# Patient Record
Sex: Female | Born: 1980 | Race: White | Hispanic: No | Marital: Married | Smoking: Never smoker
Health system: Southern US, Community
[De-identification: ages and names within clinical notes are randomized; demographics above are authoritative.]

## PROBLEM LIST (undated history)

## (undated) ENCOUNTER — Inpatient Hospital Stay (HOSPITAL_COMMUNITY): Payer: Self-pay

## (undated) DIAGNOSIS — IMO0002 Reserved for concepts with insufficient information to code with codable children: Secondary | ICD-10-CM

## (undated) DIAGNOSIS — R87619 Unspecified abnormal cytological findings in specimens from cervix uteri: Secondary | ICD-10-CM

## (undated) DIAGNOSIS — Z8619 Personal history of other infectious and parasitic diseases: Secondary | ICD-10-CM

## (undated) HISTORY — DX: Personal history of other infectious and parasitic diseases: Z86.19

## (undated) HISTORY — PX: BREAST SURGERY: SHX581

## (undated) HISTORY — DX: Unspecified abnormal cytological findings in specimens from cervix uteri: R87.619

## (undated) HISTORY — DX: Reserved for concepts with insufficient information to code with codable children: IMO0002

---
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
## 2012-07-29 ENCOUNTER — Other Ambulatory Visit (HOSPITAL_COMMUNITY): Payer: Self-pay | Admitting: Gynecology

## 2012-07-29 DIAGNOSIS — Z3141 Encounter for fertility testing: Secondary | ICD-10-CM

## 2012-08-05 ENCOUNTER — Ambulatory Visit (HOSPITAL_COMMUNITY): Payer: 59

## 2012-08-22 ENCOUNTER — Other Ambulatory Visit (HOSPITAL_COMMUNITY): Payer: Self-pay | Admitting: Gynecology

## 2012-08-22 DIAGNOSIS — Z3141 Encounter for fertility testing: Secondary | ICD-10-CM

## 2012-08-27 ENCOUNTER — Ambulatory Visit (HOSPITAL_COMMUNITY): Admission: RE | Admit: 2012-08-27 | Payer: 59 | Source: Ambulatory Visit

## 2012-08-28 ENCOUNTER — Ambulatory Visit (HOSPITAL_COMMUNITY): Payer: 59

## 2012-08-29 ENCOUNTER — Ambulatory Visit (HOSPITAL_COMMUNITY)
Admission: RE | Admit: 2012-08-29 | Discharge: 2012-08-29 | Disposition: A | Discharge status: Home / Self Care | Payer: 59 | Source: Ambulatory Visit | Attending: Gynecology | Admitting: Gynecology

## 2012-08-29 DIAGNOSIS — Z3141 Encounter for fertility testing: Secondary | ICD-10-CM

## 2012-08-29 DIAGNOSIS — N979 Female infertility, unspecified: Secondary | ICD-10-CM | POA: Insufficient documentation

## 2012-08-29 IMAGING — RF DG HYSTEROGRAM
6 series · 12 of 12 positions shown · non-contrast
Comparison: none

***ADDENDUM*** CREATED: [DATE] [DATE]

Addendum by Dr. SEO on [DATE].  I was asked by the
referring physician's office to review this examination and comment
on the transverse processes.  Please note this examination is not a
diagnostic examination for evaluation of bony anatomy, although
allowing for technique, there is no visualized abnormality in the
visualized aspects of the bony pelvis, including the transverse
processes at the expected L5 level.  There can be normal anatomic
variation and asymmetry, which may account for the apparent smaller
size of the left transverse process as compared to the right.  If
there is a clinical question referring to the lumbar spine,
diagnostic lumbar spine five view series would be recommended for
further evaluation.
CLINICAL DATA: Infertility
HYSTEROSALPINGOGRAM
TECHNIQUE: Hysterosalpingogram was performed by the ordering
physician under fluoroscopy.  Fluoroscopic images are submitted for
interpretation following the procedure.
Fluoroscopy Time:  1.0 minutes.

[Series 1: run · 2 of 2 slices shown (1 of 6)]
[im 1/2]
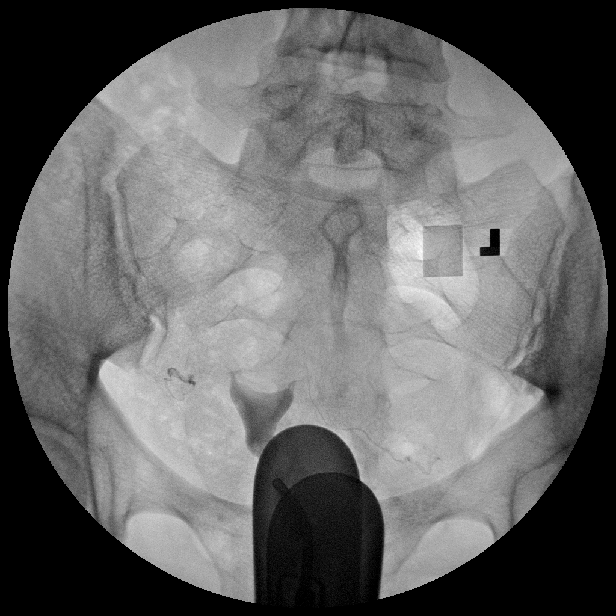
[im 2/2]
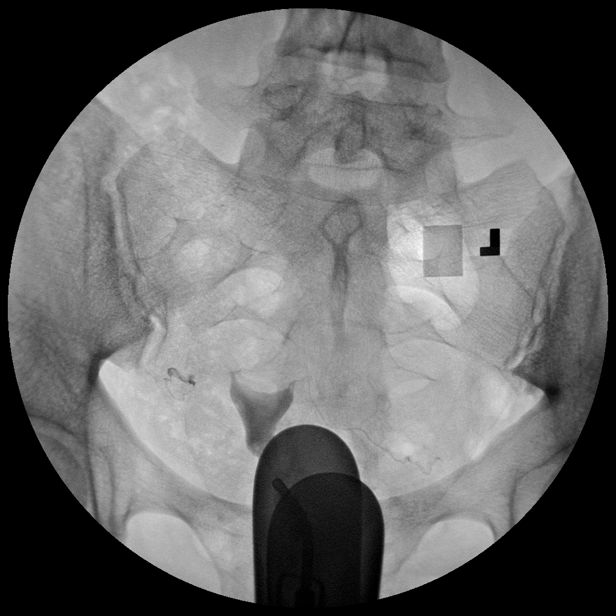

[Series 2: run · 2 of 2 slices shown (2 of 6)]
[im 1/2]
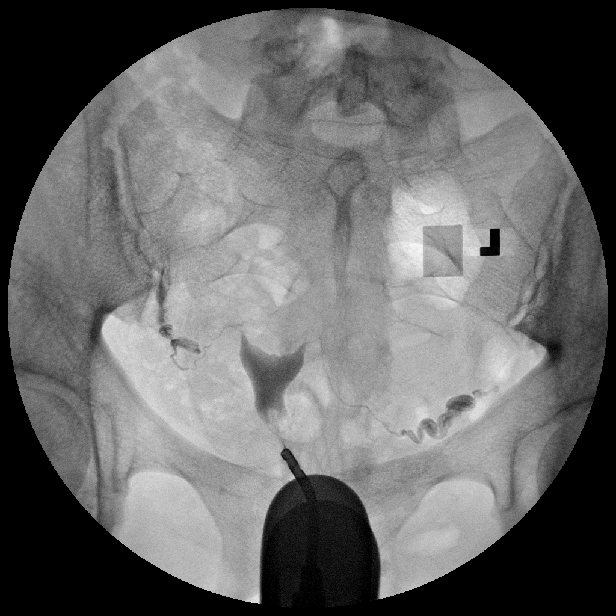
[im 2/2]
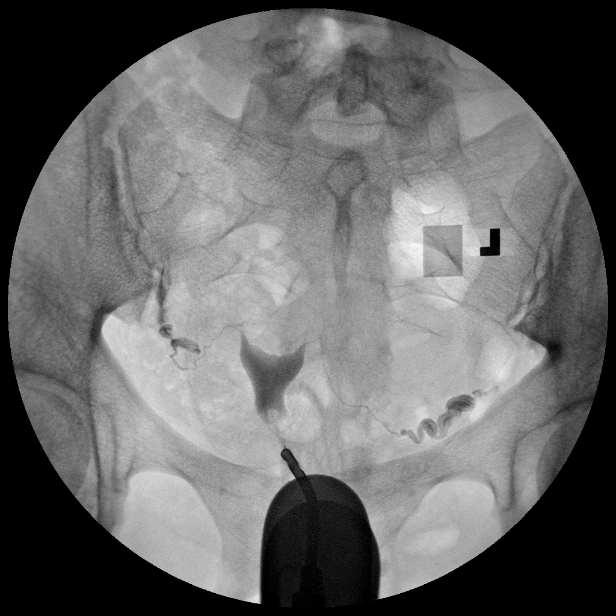

[Series 3: run · 2 of 2 slices shown (3 of 6)]
[im 1/2]
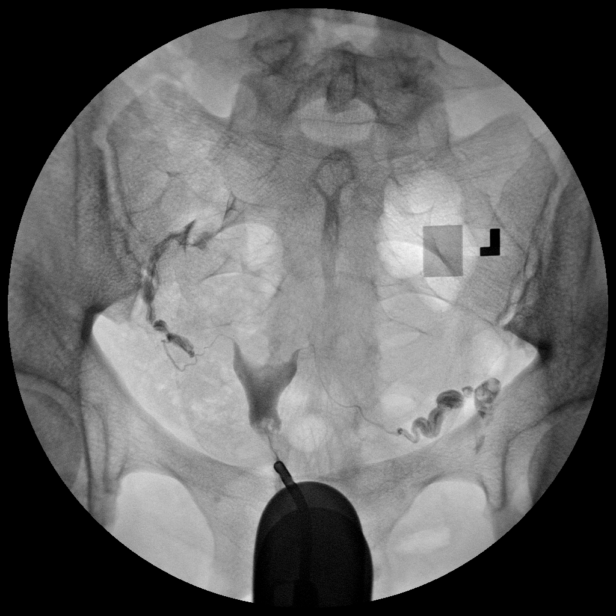
[im 2/2]
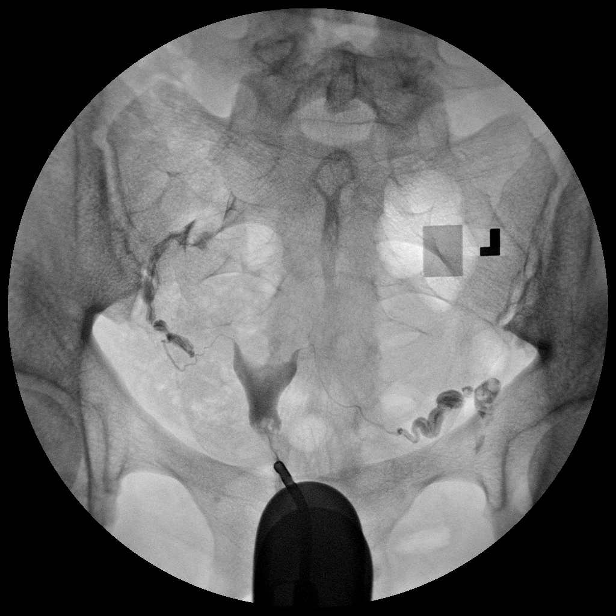

[Series 4: run · 2 of 2 slices shown (4 of 6)]
[im 1/2]
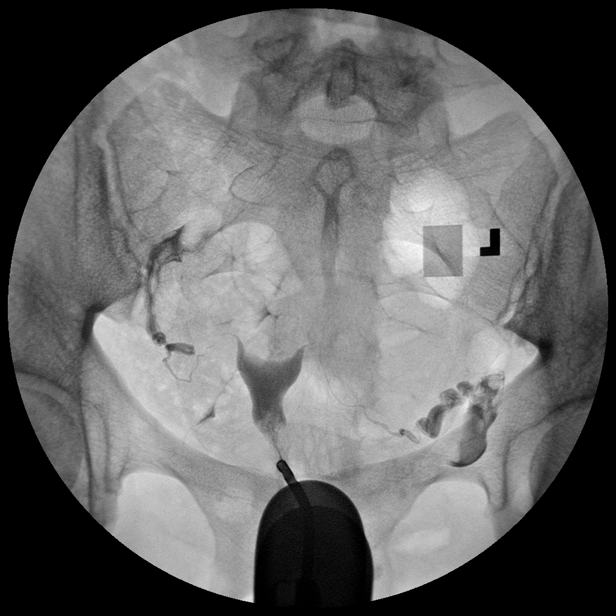
[im 2/2]
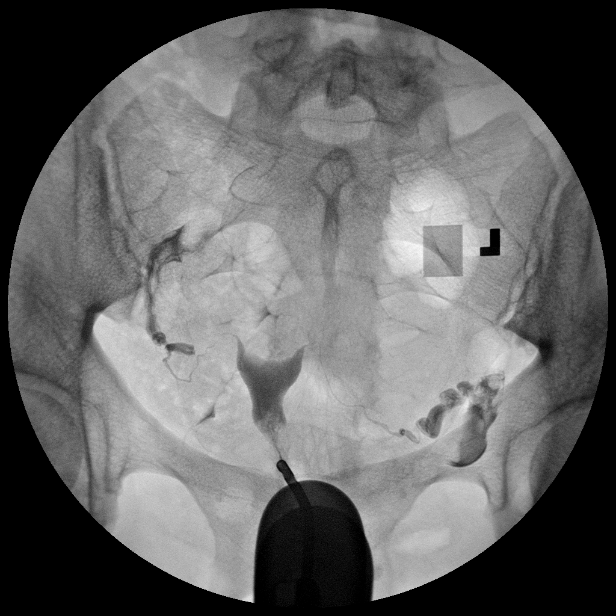

[Series 5: run · 2 of 2 slices shown (5 of 6)]
[im 1/2]
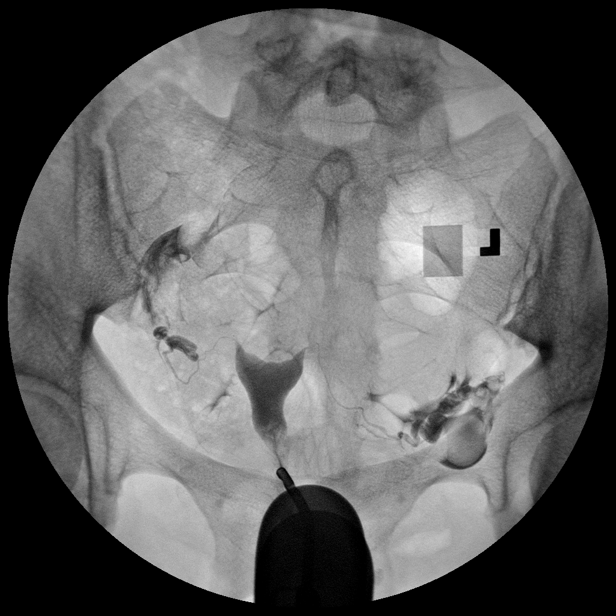
[im 2/2]
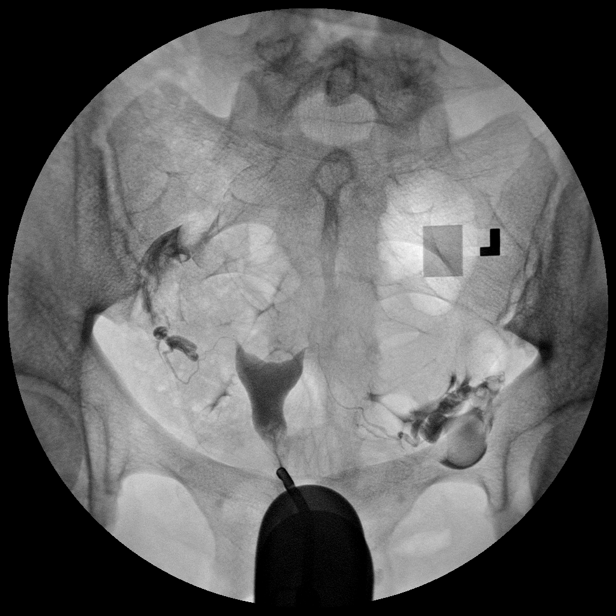

[Series 6: run · 2 of 2 slices shown (6 of 6)]
[im 1/2]
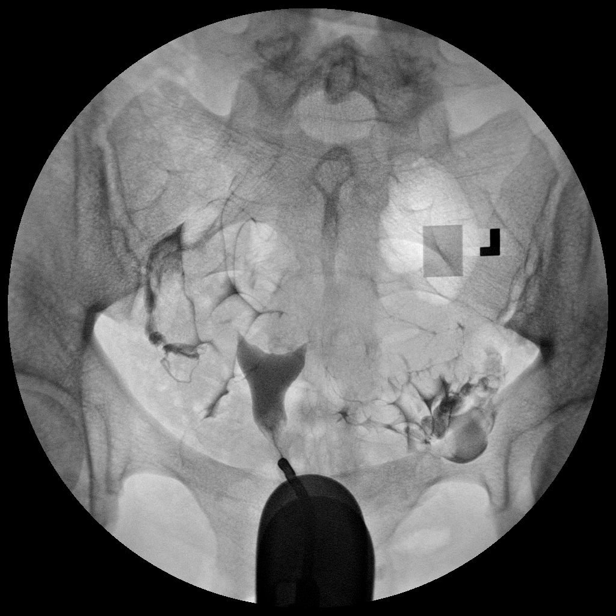
[im 2/2]
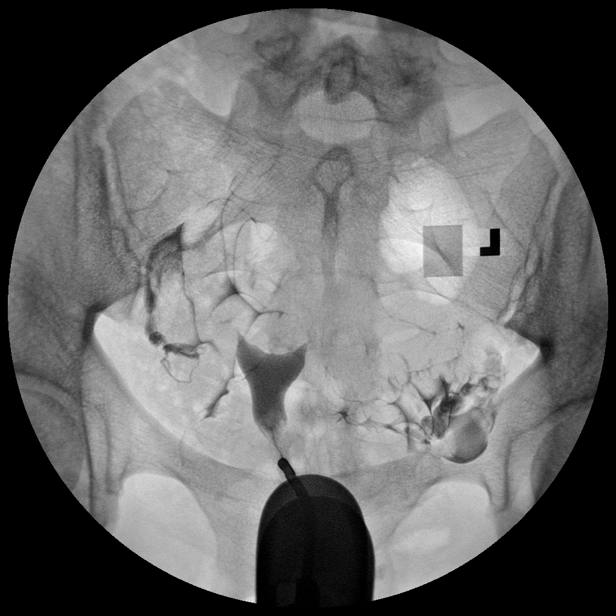

[12 of 12 positions shown; findings below may reference images not displayed]

FINDINGS: The endometrial cavity of the uterus is normal in contour
and appearance.

Contrast filling of both fallopian tubes is seen, and both tubes
are normal in appearance.  Intraperitoneal spill of the contrast
from both fallopian tubes is demonstrated.
IMPRESSION: Normal study.  Fallopian tubes are patent bilaterally.

## 2012-08-29 MED ORDER — IOHEXOL 300 MG/ML  SOLN
20.0000 mL | Freq: Once | INTRAMUSCULAR | Status: AC | PRN
  Administered 2012-08-29: 6 mL

## 2012-12-05 LAB — OB RESULTS CONSOLE ANTIBODY SCREEN: Antibody Screen: NEGATIVE

## 2012-12-05 LAB — OB RESULTS CONSOLE RUBELLA ANTIBODY, IGM: Rubella: IMMUNE

## 2012-12-05 LAB — OB RESULTS CONSOLE RPR: RPR: NONREACTIVE

## 2012-12-05 LAB — OB RESULTS CONSOLE ABO/RH: RH Type: POSITIVE

## 2012-12-05 LAB — OB RESULTS CONSOLE HEPATITIS B SURFACE ANTIGEN: Hepatitis B Surface Ag: NEGATIVE

## 2013-05-10 ENCOUNTER — Inpatient Hospital Stay (HOSPITAL_COMMUNITY): Payer: Managed Care, Other (non HMO)

## 2013-05-10 ENCOUNTER — Encounter (HOSPITAL_COMMUNITY): Payer: Self-pay | Admitting: Family

## 2013-05-10 ENCOUNTER — Inpatient Hospital Stay (HOSPITAL_COMMUNITY)
Admission: AD | Admit: 2013-05-10 | Discharge: 2013-05-10 | Disposition: A | Discharge status: Home / Self Care | Payer: Managed Care, Other (non HMO) | Source: Ambulatory Visit | Attending: Obstetrics and Gynecology | Admitting: Obstetrics and Gynecology

## 2013-05-10 DIAGNOSIS — O4690 Antepartum hemorrhage, unspecified, unspecified trimester: Secondary | ICD-10-CM | POA: Insufficient documentation

## 2013-05-10 DIAGNOSIS — O4693 Antepartum hemorrhage, unspecified, third trimester: Secondary | ICD-10-CM

## 2013-05-10 LAB — CBC
HCT: 37.7 % (ref 36.0–46.0)
MCH: 29.5 pg (ref 26.0–34.0)
MCHC: 34.2 g/dL (ref 30.0–36.0)
MCV: 86.3 fL (ref 78.0–100.0)
Platelets: 161 10*3/uL (ref 150–400)
RBC: 4.37 MIL/uL (ref 3.87–5.11)
RDW: 14.4 % (ref 11.5–15.5)

## 2013-05-10 LAB — URINALYSIS, ROUTINE W REFLEX MICROSCOPIC
Bilirubin Urine: NEGATIVE
Glucose, UA: NEGATIVE mg/dL
Ketones, ur: NEGATIVE mg/dL
Leukocytes, UA: NEGATIVE
Nitrite: NEGATIVE
Protein, ur: NEGATIVE mg/dL
Specific Gravity, Urine: <1.005 — ABNORMAL LOW (ref 1.005–1.030)
Urobilinogen, UA: 0.2 mg/dL (ref 0.0–1.0)
pH: 6.5 (ref 5.0–8.0)

## 2013-05-10 LAB — URINE MICROSCOPIC-ADD ON

## 2013-05-10 NOTE — MAU Note (Addendum)
Pt states had sex last pm. Had to wear panty liner r/t spotting that began this am around 1000. Told to come in per MD for eval.

## 2013-05-10 NOTE — MAU Note (Signed)
32 yo, G1P0 at [redacted]w[redacted]d, presents to MAU with co VB after intercourse last night. Denies cramping; reports +FM.

## 2013-05-10 NOTE — MAU Provider Note (Signed)
History     CSN: [id number]  Arrival date and time: 05/10/13 1136   First Provider Initiated Contact with Patient 05/10/13 1211      Chief Complaint  Patient presents with  . Vaginal Bleeding   HPI Ms. Tina Lowery is a 32 y.o. G1P0 at [redacted]w[redacted]d who presents to MAU today with complaint of vaginal bleeding since 0945 today. The patient states that the bleeding has developed into that similar to a light period. She denies history of bleeding in this pregnancy or any complications during this pregnancy. She denies abdominal pain or contractions today. She reports good fetal movement. She endorses intercourse last night.   OB History   Grav Para Term Preterm Abortions TAB SAB Ect Mult Living   1               No past medical history on file.  No past surgical history on file.  No family history on file.  History  Substance Use Topics  . Smoking status: Not on file  . Smokeless tobacco: Not on file  . Alcohol Use: Not on file    Allergies:  Allergies  Allergen Reactions  . Penicillins Swelling    Prescriptions prior to admission  Medication Sig Dispense Refill  . diphenhydrAMINE (BENADRYL) 25 MG tablet Take 25 mg by mouth every 6 (six) hours as needed for itching or sleep.      . fexofenadine (ALLEGRA) 60 MG tablet Take 60 mg by mouth daily.      . Prenatal Vit-Fe Fumarate-FA (PRENATAL MULTIVITAMIN) TABS tablet Take 1 tablet by mouth daily at 12 noon.        Review of Systems  Constitutional: Negative for malaise/fatigue.  Gastrointestinal: Negative for abdominal pain.  Genitourinary: Negative for dysuria, urgency and frequency.       + vaginal bleeding Neg - vaginal discharge   Physical Exam   Blood pressure 134/97, pulse 107, temperature 98.4 F (36.9 C), temperature source Oral, resp. rate 16, last menstrual period 08/22/2012.  Physical Exam  Constitutional: She is oriented to person, place, and time. She appears well-developed and well-nourished. No  distress.  HENT:  Head: Normocephalic and atraumatic.  Cardiovascular: Normal rate, regular rhythm and normal heart sounds.   Respiratory: Effort normal and breath sounds normal. No respiratory distress.  GI: Soft. Bowel sounds are normal. She exhibits no distension and no mass. There is no tenderness. There is no rebound and no guarding.  Genitourinary: Uterus is enlarged (appropriate for GA). Uterus is not tender. Cervix exhibits no motion tenderness, no discharge and no friability. There is bleeding (moderate blood noted in the vagina with one small clot) around the vagina. No vaginal discharge found.  Neurological: She is alert and oriented to person, place, and time.  Skin: Skin is warm and dry. No erythema.  Psychiatric: She has a normal mood and affect.  Cervix: closed, long, thick and posterior   Results for orders placed during the hospital encounter of 05/10/13 (from the past 24 hour(s))  URINALYSIS, ROUTINE W REFLEX MICROSCOPIC     Status: Abnormal   Collection Time    05/10/13 11:45 AM      Result Value Range   Color, Urine RED (*) YELLOW   APPearance HAZY (*) CLEAR   Specific Gravity, Urine <1.005 (*) 1.005 - 1.030   pH 6.5  5.0 - 8.0   Glucose, UA NEGATIVE  NEGATIVE mg/dL   Hgb urine dipstick LARGE (*) NEGATIVE   Bilirubin Urine NEGATIVE  NEGATIVE  Ketones, ur NEGATIVE  NEGATIVE mg/dL   Protein, ur NEGATIVE  NEGATIVE mg/dL   Urobilinogen, UA 0.2  0.0 - 1.0 mg/dL   Nitrite NEGATIVE  NEGATIVE   Leukocytes, UA NEGATIVE  NEGATIVE  URINE MICROSCOPIC-ADD ON     Status: Abnormal   Collection Time    05/10/13 11:45 AM      Result Value Range   Squamous Epithelial / LPF FEW (*) RARE   WBC, UA 0-2  <3 WBC/hpf   RBC / HPF 21-50  <3 RBC/hpf   Bacteria, UA FEW (*) RARE  CBC     Status: None   Collection Time    05/10/13 12:51 PM      Result Value Range   WBC 9.9  4.0 - 10.5 K/uL   RBC 4.37  3.87 - 5.11 MIL/uL   Hemoglobin 12.9  12.0 - 15.0 g/dL   HCT 16.1  09.6 - 04.5 %    MCV 86.3  78.0 - 100.0 fL   MCH 29.5  26.0 - 34.0 pg   MCHC 34.2  30.0 - 36.0 g/dL   RDW 40.9  81.1 - 91.4 %   Platelets 161  150 - 400 K/uL   US OB Limited  Shows no evidence of previa or abruption  MAU Course  Procedures  MDM Discussed with Dr. Marcelle Overlie. Get a CBC and Limited US today Discussed lab and [place] results with Dr. Marcelle Overlie. Ok for discharge with bleeding precautions. Follow-up as scheduled this week Assessment and Plan  A: Vaginal bleeding in pregnancy, third trimester  P: Discharge home Bleeding precautions discussed Patient advised to keep scheduled follow-up this week in the office Patient may return to MAU as needed or if her condition were to change or worsen   Freddi Starr, PA-C  05/10/2013, 3:02 PM

## 2013-06-15 LAB — OB RESULTS CONSOLE GBS: GBS: POSITIVE

## 2013-07-03 ENCOUNTER — Telehealth (HOSPITAL_COMMUNITY): Payer: Self-pay | Admitting: *Deleted

## 2013-07-03 ENCOUNTER — Encounter (HOSPITAL_COMMUNITY): Payer: Self-pay | Admitting: *Deleted

## 2013-07-03 NOTE — Telephone Encounter (Signed)
Preadmission screen  

## 2013-07-10 ENCOUNTER — Encounter (HOSPITAL_COMMUNITY): Payer: Self-pay | Admitting: *Deleted

## 2013-07-10 ENCOUNTER — Inpatient Hospital Stay (HOSPITAL_COMMUNITY)
Admission: AD | Admit: 2013-07-10 | Discharge: 2013-07-12 | DRG: 775 | Disposition: A | Discharge status: Home / Self Care | Payer: Managed Care, Other (non HMO) | Source: Ambulatory Visit | Attending: Obstetrics and Gynecology | Admitting: Obstetrics and Gynecology

## 2013-07-10 ENCOUNTER — Encounter (HOSPITAL_COMMUNITY): Payer: Managed Care, Other (non HMO) | Admitting: Anesthesiology

## 2013-07-10 ENCOUNTER — Inpatient Hospital Stay (HOSPITAL_COMMUNITY): Payer: Managed Care, Other (non HMO) | Admitting: Anesthesiology

## 2013-07-10 DIAGNOSIS — O4290 Premature rupture of membranes, unspecified as to length of time between rupture and onset of labor, unspecified weeks of gestation: Secondary | ICD-10-CM

## 2013-07-10 DIAGNOSIS — O99892 Other specified diseases and conditions complicating childbirth: Secondary | ICD-10-CM | POA: Diagnosis present

## 2013-07-10 DIAGNOSIS — Z22330 Carrier of Group B streptococcus: Secondary | ICD-10-CM

## 2013-07-10 DIAGNOSIS — O139 Gestational [pregnancy-induced] hypertension without significant proteinuria, unspecified trimester: Secondary | ICD-10-CM | POA: Diagnosis present

## 2013-07-10 LAB — CBC
MCH: 28.8 pg (ref 26.0–34.0)
MCV: 83.2 fL (ref 78.0–100.0)
Platelets: 155 10*3/uL (ref 150–400)
RBC: 4.52 MIL/uL (ref 3.87–5.11)
RDW: 13.7 % (ref 11.5–15.5)
WBC: 9.7 10*3/uL (ref 4.0–10.5)

## 2013-07-10 LAB — POCT FERN TEST: POCT Fern Test: POSITIVE

## 2013-07-10 LAB — URINALYSIS, ROUTINE W REFLEX MICROSCOPIC
Bilirubin Urine: NEGATIVE
Ketones, ur: 15 mg/dL — AB
Nitrite: NEGATIVE
Urobilinogen, UA: 0.2 mg/dL (ref 0.0–1.0)
pH: 6 (ref 5.0–8.0)

## 2013-07-10 LAB — COMPREHENSIVE METABOLIC PANEL
ALT: 16 U/L (ref 0–35)
Albumin: 2.8 g/dL — ABNORMAL LOW (ref 3.5–5.2)
Alkaline Phosphatase: 151 U/L — ABNORMAL HIGH (ref 39–117)
BUN: 8 mg/dL (ref 6–23)
Chloride: 100 mEq/L (ref 96–112)
Creatinine, Ser: 0.54 mg/dL (ref 0.50–1.10)
GFR calc Af Amer: >90 mL/min (ref >90)
GFR calc non Af Amer: >90 mL/min (ref >90)
Glucose, Bld: 99 mg/dL (ref 70–99)
Potassium: 3.8 mEq/L (ref 3.5–5.1)
Total Bilirubin: 0.1 mg/dL — ABNORMAL LOW (ref 0.3–1.2)
Total Protein: 6.1 g/dL (ref 6.0–8.3)

## 2013-07-10 LAB — PROTEIN / CREATININE RATIO, URINE
Creatinine, Urine: 72.67 mg/dL
Protein Creatinine Ratio: 0.15 (ref 0.00–0.15)
Total Protein, Urine: 10.6 mg/dL

## 2013-07-10 LAB — URINE MICROSCOPIC-ADD ON

## 2013-07-10 MED ORDER — FENTANYL 2.5 MCG/ML BUPIVACAINE 1/10 % EPIDURAL INFUSION (WH - ANES)
14.0000 mL/h | INTRAMUSCULAR | Status: DC | PRN
  Administered 2013-07-10: 14 mL/h via EPIDURAL
  Filled 2013-07-10: qty 125

## 2013-07-10 MED ORDER — CITRIC ACID-SODIUM CITRATE 334-500 MG/5ML PO SOLN: 30.0000 mL | ORAL | Status: DC | PRN

## 2013-07-10 MED ORDER — OXYTOCIN 40 UNITS IN LACTATED RINGERS INFUSION - SIMPLE MED
1.0000 m[IU]/min | INTRAVENOUS | Status: DC
  Administered 2013-07-10: 2 m[IU]/min via INTRAVENOUS
  Filled 2013-07-10: qty 1000

## 2013-07-10 MED ORDER — WITCH HAZEL-GLYCERIN EX PADS
1.0000 "application " | MEDICATED_PAD | CUTANEOUS | Status: DC | PRN
  Administered 2013-07-12: 1 via TOPICAL

## 2013-07-10 MED ORDER — LACTATED RINGERS IV SOLN
INTRAVENOUS | Status: DC
  Administered 2013-07-10 (×3): via INTRAVENOUS

## 2013-07-10 MED ORDER — SENNOSIDES-DOCUSATE SODIUM 8.6-50 MG PO TABS
2.0000 | ORAL_TABLET | ORAL | Status: DC
  Administered 2013-07-11 – 2013-07-12 (×2): 2 via ORAL
  Filled 2013-07-10 (×2): qty 2

## 2013-07-10 MED ORDER — PHENYLEPHRINE 40 MCG/ML (10ML) SYRINGE FOR IV PUSH (FOR BLOOD PRESSURE SUPPORT)
80.0000 ug | PREFILLED_SYRINGE | INTRAVENOUS | Status: DC | PRN
  Filled 2013-07-10: qty 2
  Filled 2013-07-10: qty 10

## 2013-07-10 MED ORDER — DIPHENHYDRAMINE HCL 25 MG PO CAPS: 25.0000 mg | ORAL_CAPSULE | Freq: Four times a day (QID) | ORAL | Status: DC | PRN

## 2013-07-10 MED ORDER — ONDANSETRON HCL 4 MG/2ML IJ SOLN: 4.0000 mg | INTRAMUSCULAR | Status: DC | PRN

## 2013-07-10 MED ORDER — FLEET ENEMA 7-19 GM/118ML RE ENEM: 1.0000 | ENEMA | Freq: Every day | RECTAL | Status: DC | PRN

## 2013-07-10 MED ORDER — VANCOMYCIN HCL IN DEXTROSE 1-5 GM/200ML-% IV SOLN
1000.0000 mg | Freq: Two times a day (BID) | INTRAVENOUS | Status: DC
  Administered 2013-07-10 (×2): 1000 mg via INTRAVENOUS
  Filled 2013-07-10 (×3): qty 200

## 2013-07-10 MED ORDER — TERBUTALINE SULFATE 1 MG/ML IJ SOLN: 0.2500 mg | Freq: Once | INTRAMUSCULAR | Status: DC | PRN

## 2013-07-10 MED ORDER — PRENATAL MULTIVITAMIN CH
1.0000 | ORAL_TABLET | Freq: Every day | ORAL | Status: DC
  Administered 2013-07-11 – 2013-07-12 (×2): 1 via ORAL
  Filled 2013-07-10 (×2): qty 1

## 2013-07-10 MED ORDER — LANOLIN HYDROUS EX OINT: TOPICAL_OINTMENT | CUTANEOUS | Status: DC | PRN

## 2013-07-10 MED ORDER — OXYCODONE-ACETAMINOPHEN 5-325 MG PO TABS: 1.0000 | ORAL_TABLET | ORAL | Status: DC | PRN

## 2013-07-10 MED ORDER — BENZOCAINE-MENTHOL 20-0.5 % EX AERO
1.0000 "application " | INHALATION_SPRAY | CUTANEOUS | Status: DC | PRN
  Filled 2013-07-10: qty 56

## 2013-07-10 MED ORDER — EPHEDRINE 5 MG/ML INJ
10.0000 mg | INTRAVENOUS | Status: DC | PRN
  Filled 2013-07-10: qty 2
  Filled 2013-07-10: qty 4

## 2013-07-10 MED ORDER — BUTORPHANOL TARTRATE 1 MG/ML IJ SOLN
1.0000 mg | INTRAMUSCULAR | Status: DC | PRN
  Administered 2013-07-10 (×2): 1 mg via INTRAVENOUS
  Filled 2013-07-10 (×2): qty 1

## 2013-07-10 MED ORDER — FLEET ENEMA 7-19 GM/118ML RE ENEM: 1.0000 | ENEMA | RECTAL | Status: DC | PRN

## 2013-07-10 MED ORDER — BISACODYL 10 MG RE SUPP: 10.0000 mg | Freq: Every day | RECTAL | Status: DC | PRN

## 2013-07-10 MED ORDER — LACTATED RINGERS IV SOLN: 500.0000 mL | INTRAVENOUS | Status: DC | PRN

## 2013-07-10 MED ORDER — LIDOCAINE HCL (PF) 1 % IJ SOLN
INTRAMUSCULAR | Status: DC | PRN
  Administered 2013-07-10 (×4): 4 mL

## 2013-07-10 MED ORDER — DIPHENHYDRAMINE HCL 50 MG/ML IJ SOLN: 12.5000 mg | INTRAMUSCULAR | Status: DC | PRN

## 2013-07-10 MED ORDER — IBUPROFEN 600 MG PO TABS: 600.0000 mg | ORAL_TABLET | Freq: Four times a day (QID) | ORAL | Status: DC | PRN

## 2013-07-10 MED ORDER — OXYTOCIN BOLUS FROM INFUSION: 500.0000 mL | INTRAVENOUS | Status: DC

## 2013-07-10 MED ORDER — PHENYLEPHRINE 40 MCG/ML (10ML) SYRINGE FOR IV PUSH (FOR BLOOD PRESSURE SUPPORT)
80.0000 ug | PREFILLED_SYRINGE | INTRAVENOUS | Status: DC | PRN
  Administered 2013-07-10: 80 ug via INTRAVENOUS
  Filled 2013-07-10: qty 2
  Filled 2013-07-10: qty 10

## 2013-07-10 MED ORDER — DIBUCAINE 1 % RE OINT
1.0000 "application " | TOPICAL_OINTMENT | RECTAL | Status: DC | PRN
  Administered 2013-07-12: 1 via RECTAL
  Filled 2013-07-10: qty 28

## 2013-07-10 MED ORDER — OXYTOCIN 40 UNITS IN LACTATED RINGERS INFUSION - SIMPLE MED: 62.5000 mL/h | INTRAVENOUS | Status: DC

## 2013-07-10 MED ORDER — TETANUS-DIPHTH-ACELL PERTUSSIS 5-2.5-18.5 LF-MCG/0.5 IM SUSP: 0.5000 mL | Freq: Once | INTRAMUSCULAR | Status: DC

## 2013-07-10 MED ORDER — ONDANSETRON HCL 4 MG/2ML IJ SOLN: 4.0000 mg | Freq: Four times a day (QID) | INTRAMUSCULAR | Status: DC | PRN

## 2013-07-10 MED ORDER — EPHEDRINE 5 MG/ML INJ
10.0000 mg | INTRAVENOUS | Status: DC | PRN
  Administered 2013-07-10: 20 mg via INTRAVENOUS
  Filled 2013-07-10: qty 2

## 2013-07-10 MED ORDER — ACETAMINOPHEN 325 MG PO TABS: 650.0000 mg | ORAL_TABLET | ORAL | Status: DC | PRN

## 2013-07-10 MED ORDER — ZOLPIDEM TARTRATE 5 MG PO TABS: 5.0000 mg | ORAL_TABLET | Freq: Every evening | ORAL | Status: DC | PRN

## 2013-07-10 MED ORDER — LIDOCAINE HCL (PF) 1 % IJ SOLN
30.0000 mL | INTRAMUSCULAR | Status: DC | PRN
  Filled 2013-07-10 (×2): qty 30

## 2013-07-10 MED ORDER — LACTATED RINGERS IV SOLN: 500.0000 mL | Freq: Once | INTRAVENOUS | Status: DC

## 2013-07-10 MED ORDER — IBUPROFEN 600 MG PO TABS
600.0000 mg | ORAL_TABLET | Freq: Four times a day (QID) | ORAL | Status: DC
  Administered 2013-07-10 – 2013-07-12 (×8): 600 mg via ORAL
  Filled 2013-07-10 (×8): qty 1

## 2013-07-10 MED ORDER — SIMETHICONE 80 MG PO CHEW: 80.0000 mg | CHEWABLE_TABLET | ORAL | Status: DC | PRN

## 2013-07-10 MED ORDER — ONDANSETRON HCL 4 MG PO TABS: 4.0000 mg | ORAL_TABLET | ORAL | Status: DC | PRN

## 2013-07-10 NOTE — Anesthesia Preprocedure Evaluation (Signed)

## 2013-07-10 NOTE — MAU Provider Note (Signed)
Chief Complaint:  Labor Eval and Rupture of Membranes  First Provider Initiated Contact with Patient 07/10/13 0227     HPI: Tina Lowery is a 32 y.o. G1P0 at [redacted]w[redacted]d who presents to maternity admissions reporting leaking fluid since midnight and contractions. Denies fever, chills, HA, vision changes, epigastric pain or vaginal bleeding. Good fetal movement.   Past Medical History: Past Medical History  Diagnosis Date  . Abnormal Pap smear   . Hx of varicella     Past obstetric history: OB History  Gravida Para Term Preterm AB SAB TAB Ectopic Multiple Living  1             # Outcome Date GA Lbr Len/2nd Weight Sex Delivery Anes PTL Lv  1 CUR               Past Surgical History: Past Surgical History  Procedure Laterality Date  . Breast surgery      aug  Cryo surgery of cervix.    Family History: Family History  Problem Relation Age of Onset  . Heart disease Maternal Grandfather   . Cancer Paternal Grandmother     melanoma    Social History: History  Substance Use Topics  . Smoking status: Never Smoker   . Smokeless tobacco: Never Used  . Alcohol Use: No    Allergies:  Allergies  Allergen Reactions  . Penicillins Swelling    Meds:  Prescriptions prior to admission  Medication Sig Dispense Refill  . diphenhydrAMINE (BENADRYL) 25 MG tablet Take 25 mg by mouth every 6 (six) hours as needed for itching or sleep.      . fexofenadine (ALLEGRA) 60 MG tablet Take 60 mg by mouth daily.      . Prenatal Vit-Fe Fumarate-FA (PRENATAL MULTIVITAMIN) TABS tablet Take 1 tablet by mouth daily at 12 noon.        ROS: Pertinent findings in history of present illness.  Physical Exam  Blood pressure 133/91, pulse 96, temperature 97.9 F (36.6 C), temperature source Oral, resp. rate 18, height 5\' 6"  (1.676 m), weight 81.647 kg (180 lb), last menstrual period 08/22/2012, SpO2 99.00%. Patient Vitals for the past 24 hrs:  BP Temp Temp src Pulse Resp SpO2 Height Weight  07/10/13  0235 133/91 mmHg - - 96 - 99 % - -  07/10/13 0206 124/92 mmHg 97.9 F (36.6 C) Oral 104 18 98 % 5\' 6"  (1.676 m) 81.647 kg (180 lb)   GENERAL: Well-developed, well-nourished female in mild distress.  HEENT: normocephalic HEART: normal rate RESP: normal effort ABDOMEN: Soft, non-tender, gravid appropriate for gestational age EXTREMITIES: Nontender, no edema NEURO: alert and oriented.   SPECULUM EXAM: NEFG, moderate amount of watery, clear, odorless mucus. ? Pooling. No blood, cervix clean Dilation: 1 Effacement (%): 80 Cervical Position: Middle Station: -2 Presentation: Vertex Exam by:: Ivonne Andrew CNM  FHT:  Baseline 120 , moderate variability, accelerations present, no decelerations Contractions: q 4-6 mins   Labs: Fern pos.   Imaging:  No results found.  MAU Course:   Assessment: SROM, early labor GBS pos. PCN allergic. Gest HTN  Plan: Admit to BS per Dr. Rana Snare. Vanc PIH labs. Dr. Rana Snare to follow.    [place], CNM 07/10/2013 2:41 AM

## 2013-07-10 NOTE — Progress Notes (Signed)
Delivery Note At 4:45 PM a viable female was delivered via Vaginal, Spontaneous Delivery (Presentation: ; Occiput Anterior).  APGAR: 8, 9; weight .   Placenta status: Intact, Spontaneous.  Cord: 3 vessels with the following complications: None.  Cord pH: pending  Anesthesia: Epidural  Episiotomy: None Lacerations:  Suture Repair: 2.0 vicryl rapide Est. Blood Loss (mL): 400 Anterior midline lac bisecting part of clitoris repaired, second degree left periurethral lac repaired, first degree ML lac repaired Mom to postpartum.  Baby to Couplet care / Skin to Skin.  Ozzie Knobel II,Kalisha Keadle E 07/10/2013, 5:03 PM

## 2013-07-10 NOTE — Anesthesia Procedure Notes (Signed)
Epidural Patient location during procedure: OB Start time: 07/10/2013 9:18 AM  Staffing Performed by: anesthesiologist   Preanesthetic Checklist Completed: patient identified, site marked, surgical consent, pre-op evaluation, timeout performed, IV checked, risks and benefits discussed and monitors and equipment checked  Epidural Patient position: sitting Prep: site prepped and draped and DuraPrep Patient monitoring: continuous pulse ox and blood pressure Approach: midline Injection technique: LOR air  Needle:  Needle type: Tuohy  Needle gauge: 17 G Needle length: 9 cm and 9 Needle insertion depth: 5 cm cm Catheter type: closed end flexible Catheter size: 19 Gauge Catheter at skin depth: 10 cm Test dose: negative  Assessment Events: blood not aspirated, injection not painful, no injection resistance, negative IV test and no paresthesia  Additional Notes Discussed risk of headache, infection, bleeding, nerve injury and failed or incomplete block.  Patient voices understanding and wishes to proceed.  Epidural placed easily on first pass.  No paresthesia.  Patient tolerated procedure well with no apparent complications.  Jasmine December, MDReason for block:procedure for pain

## 2013-07-10 NOTE — H&P (Signed)
Tina Lowery is a 32 y.o. female presenting for SROM about midnight, clear followed by UCs. Denies HA/vision change, denies epigastric pain. Maternal Medical History:  Reason for admission: Rupture of membranes.   Contractions: Onset was 3-5 hours ago.    Fetal activity: Perceived fetal activity is normal.      OB History   Grav Para Term Preterm Abortions TAB SAB Ect Mult Living   1              Past Medical History  Diagnosis Date  . Abnormal Pap smear   . Hx of varicella    Past Surgical History  Procedure Laterality Date  . Breast surgery      aug   Family History: family history includes Cancer in her paternal grandmother; Heart disease in her maternal grandfather. Social History:  reports that she has never smoked. She has never used smokeless tobacco. She reports that she does not drink alcohol or use illicit drugs.   Prenatal Transfer Tool  Maternal Diabetes: No Genetic Screening: Normal Maternal Ultrasounds/Referrals: Normal Fetal Ultrasounds or other Referrals:  None Maternal Substance Abuse:  No Significant Maternal Medications:  None Significant Maternal Lab Results:  None Other Comments:  None  Review of Systems  Eyes: Negative for blurred vision.  Gastrointestinal: Negative for abdominal pain.  Neurological: Negative for headaches.    Dilation: 1 Effacement (%): 80 Station: -2 Exam by:: VKatrinka Blazing CNM Blood pressure 131/84, pulse 86, temperature 98.8 F (37.1 C), temperature source Oral, resp. rate 20, height 5\' 6"  (1.676 m), weight 180 lb (81.647 kg), last menstrual period 08/22/2012, SpO2 99.00%. Maternal Exam:  Uterine Assessment: Contraction strength is firm.  Contraction frequency is irregular.      Fetal Exam Fetal State Assessment: Category I - tracings are normal.     Physical Exam  Cardiovascular: Normal rate and regular rhythm.   Respiratory: Effort normal and breath sounds normal.  GI: Soft. There is no tenderness.   Neurological: She has normal reflexes.    Prenatal labs: ABO, Rh: O/Positive/-- (05/23 0000) Antibody: Negative (05/23 0000) Rubella: Immune (05/23 0000) RPR: Nonreactive (05/23 0000)  HBsAg: Negative (05/23 0000)  HIV: Non-reactive (05/23 0000)  GBS: Positive (12/01 0000)   Assessment/Plan: 32 yo G1P0 at 40 4/7 weeks with SROM Patient requesting epidural Will check cervix after that   Tzvi Economou II,Karanveer Ramakrishnan E 07/10/2013, 8:16 AM

## 2013-07-10 NOTE — Progress Notes (Signed)
Cx 2/90/-1 S/P cryotx , scar on cx broken down by examining finger FHT decel after epidural treated by Dr Rodman Pickle, now [facility] reactive UCs q 2-4 min IUPC placed

## 2013-07-10 NOTE — MAU Note (Signed)
Pt reports contractions and ? Leaking fluid since midnight

## 2013-07-10 NOTE — Progress Notes (Signed)
Cx C/C/+1 FHT good response to scalp stim Begin second stage

## 2013-07-11 LAB — CBC
MCH: 28.8 pg (ref 26.0–34.0)
MCHC: 34.5 g/dL (ref 30.0–36.0)
MCV: 83.5 fL (ref 78.0–100.0)
Platelets: 154 10*3/uL (ref 150–400)
RBC: 3.93 MIL/uL (ref 3.87–5.11)
RDW: 13.9 % (ref 11.5–15.5)

## 2013-07-11 NOTE — Anesthesia Postprocedure Evaluation (Signed)
Anesthesia Post Note  Patient: Tina Lowery  Procedure(s) Performed: * No procedures listed *  Anesthesia type: Epidural  Patient location: Mother/Baby  Post pain: Pain level controlled  Post assessment: Post-op Vital signs reviewed  Last Vitals:  Filed Vitals:   07/11/13 0635  BP: 112/72  Pulse: 109  Temp: 36.8 C  Resp: 19    Post vital signs: Reviewed  Level of consciousness:alert  Complications: No apparent anesthesia complications

## 2013-07-11 NOTE — Lactation Note (Signed)
This note was copied from the chart of Tina Lowery. Lactation Consultation Note Follow up visit at 25 hours of age.  Baby has not latched at all per mom and they have spoon fed in the past hour.  Baby showing feeding cues and RN at bedside to do PKU.  Mom gave baby a pacifier to keep calm.  Discussed use of pacifier hinders feeding cues.  Assisted in cross cradle position on right breast, baby fussy no latch.  Baby is congested sounding and saline drops were used to clear nasal airway with success.  Repositioned to football on right breast with pillow support, minimal assistance needed to compress breast for latch.  Mom has been wearing shells.  Few attempts and baby maintained deep latch with rhythmic suckling and swallows audible.  After several minutes stimulation needed to keep baby awake for feeding.  Baby continued to breast feed for a total of 15 minutes with colostrum easily expressed at the end of feeding. Mom did report some positional pain and lower lip was repeatedly rolled out.  Demonstrated this to mom.  Baby appears satisfied with feeding, Mom to call for assist as needed.   Patient Name: Tina Lowery [id number] Date: 07/11/2013 Reason for consult: Follow-up assessment;Difficult latch   Maternal Data    Feeding Feeding Type: Breast Fed Length of feed: 15 min  LATCH Score/Interventions Latch: Grasps breast easily, tongue down, lips flanged, rhythmical sucking. Intervention(s): Waking techniques;Teach feeding cues;Skin to skin Intervention(s): Adjust position;Assist with latch;Breast compression  Audible Swallowing: Spontaneous and intermittent Intervention(s): Hand expression;Skin to skin  Type of Nipple: Flat Intervention(s): Shells  Comfort (Breast/Nipple): Soft / non-tender     Hold (Positioning): Assistance needed to correctly position infant at breast and maintain latch. Intervention(s): Breastfeeding basics reviewed;Support Pillows;Position options;Skin to  skin  LATCH Score: 8  Lactation Tools Discussed/Used Tools: Shells   Consult Status Consult Status: Follow-up Date: 07/12/13 Follow-up type: In-patient    Beverely Risen Arvella Merles 07/11/2013, 5:56 PM

## 2013-07-11 NOTE — Progress Notes (Signed)
Post Partum Day 1 Subjective: no complaints, up ad lib, voiding and tolerating PO  Objective: Blood pressure 112/72, pulse 109, temperature 98.2 F (36.8 C), temperature source Oral, resp. rate 19, height 5\' 6"  (1.676 m), weight 180 lb (81.647 kg), last menstrual period 08/22/2012, SpO2 97.00%, unknown if currently breastfeeding.  Physical Exam:  General: alert, cooperative and no distress Lochia: appropriate Uterine Fundus: firm Incision: healing well DVT Evaluation: No evidence of DVT seen on physical exam.   Recent Labs  07/10/13 0310 07/11/13 0550  HGB 13.0 11.3*  HCT 37.6 32.8*    Assessment/Plan: Plan for discharge tomorrow   LOS: 1 day   Haydan Mansouri II,Dragan Tamburrino E 07/11/2013, 9:06 AM

## 2013-07-12 MED ORDER — IBUPROFEN 600 MG PO TABS: 600.0000 mg | ORAL_TABLET | Freq: Four times a day (QID) | ORAL | Status: DC | PRN

## 2013-07-12 MED ORDER — OXYCODONE-ACETAMINOPHEN 5-325 MG PO TABS: 1.0000 | ORAL_TABLET | Freq: Four times a day (QID) | ORAL | Status: DC | PRN

## 2013-07-12 NOTE — Lactation Note (Signed)
This note was copied from the chart of Tina Lowery. Lactation Consultation Note [name]. Instructions on set up, use and cleaning discussed.   Patient Name: Tina Lowery [id number] Date: 07/12/2013 Reason for consult: Follow-up assessment   Maternal Data    Feeding Feeding Type: Breast Fed Length of feed: 20 min (15 minutes observed)  LATCH Score/Interventions Latch: Grasps breast easily, tongue down, lips flanged, rhythmical sucking. Intervention(s): Waking techniques;Teach feeding cues;Skin to skin Intervention(s): Adjust position;Assist with latch;Breast compression  Audible Swallowing: Spontaneous and intermittent Intervention(s): Hand expression;Skin to skin Intervention(s): Hand expression;Skin to skin  Type of Nipple: Flat (nipple erects with stimulation) Intervention(s): Reverse pressure;Shells;Hand pump  Comfort (Breast/Nipple): Filling, red/small blisters or bruises, mild/mod discomfort  Problem noted: Filling  Hold (Positioning): No assistance needed to correctly position infant at breast. Intervention(s): Support Pillows;Skin to skin;Breastfeeding basics reviewed  LATCH Score: 8  Lactation Tools Discussed/Used Tools: Shells Initiated by:: Franz Dell RN Date initiated:: 07/12/13   Consult Status Consult Status: Follow-up Date: 07/13/13 Follow-up type: In-patient    Tina Lowery 07/12/2013, 12:39 PM

## 2013-07-12 NOTE — Progress Notes (Signed)
Post Partum Day 2 Subjective: no complaints, up ad lib, voiding, tolerating PO and + flatus  Objective: Blood pressure 95/58, pulse 80, temperature 97.9 F (36.6 C), temperature source Oral, resp. rate 18, height 5\' 6"  (1.676 m), weight 180 lb (81.647 kg), last menstrual period 08/22/2012, SpO2 98.00%, unknown if currently breastfeeding.  Physical Exam:  General: alert, cooperative and no distress Lochia: appropriate Uterine Fundus: firm Incision: healing well DVT Evaluation: No evidence of DVT seen on physical exam.   Recent Labs  07/10/13 0310 07/11/13 0550  HGB 13.0 11.3*  HCT 37.6 32.8*    Assessment/Plan: Discharge home   LOS: 2 days   Tina Lowery,Tina Lowery 07/12/2013, 8:15 AM

## 2013-07-12 NOTE — Lactation Note (Signed)
This note was copied from the chart of Boy Diora Bellizzi. Lactation Consultation Note Follow up visit at 42 hours of age.  Mom reports cluster feeding and then baby to sleepy to eat since 2am (8 hours).  Mom has been trying for past hour to wake baby for feeding.  Waking techniques taught.  Mom's breast are filling and baby has a hard time latching with assist.  Hand expressed colostrum into medicine cup to offer with spoon after feeding as desired.  Softened breast and areola enough to latch baby. Baby with wide flanged lips and rhythmic suckling for greater than 15 minutes with audible gulping.  Mom continues to stimulate some for suckling after initial 10 minutes of active suckling.  Hand pump given and instructed on use.  Referred to baby and me booklet for storage guidelines, intake and output for babies age.  Baby has only had 6 feedings in past 24 hours.  Explained to mom we expect 8-10 and to wake baby and place skin to skin if feeding cues are missed after about 3 hours. Discussed cluster feeding and engorgement care.  Mom is awaiting discharge orders and has lactation phone # for concerns after discharge if needed.    Patient Name: Boy Aliscia Clayton ZOXWR'U Date: 07/12/2013 Reason for consult: Follow-up assessment   Maternal Data    Feeding Feeding Type: Breast Fed Length of feed: 15 min (15 minutes observed)  LATCH Score/Interventions Latch: Grasps breast easily, tongue down, lips flanged, rhythmical sucking. Intervention(s): Waking techniques;Teach feeding cues;Skin to skin Intervention(s): Adjust position;Assist with latch;Breast compression  Audible Swallowing: Spontaneous and intermittent Intervention(s): Hand expression;Skin to skin Intervention(s): Hand expression;Skin to skin  Type of Nipple: Flat Intervention(s): Reverse pressure;Shells;Hand pump  Comfort (Breast/Nipple): Filling, red/small blisters or bruises, mild/mod discomfort  Problem noted: Filling  Hold  (Positioning): No assistance needed to correctly position infant at breast. Intervention(s): Support Pillows;Skin to skin;Breastfeeding basics reviewed  LATCH Score: 8  Lactation Tools Discussed/Used Tools: Shells Initiated by:: Franz Dell RN Date initiated:: 07/12/13   Consult Status Consult Status: Follow-up Date: 07/13/13 Follow-up type: In-patient    Jannifer Rodney 07/12/2013, 10:52 AM

## 2013-07-12 NOTE — Discharge Summary (Signed)
Obstetric Discharge Summary Reason for Admission: onset of labor Prenatal Procedures: ultrasound Intrapartum Procedures: spontaneous vaginal delivery Postpartum Procedures: none Complications-Operative and Postpartum: none Hemoglobin  Date Value Range Status  07/11/2013 11.3* 12.0 - 15.0 g/dL Final     HCT  Date Value Range Status  07/11/2013 32.8* 36.0 - 46.0 % Final    Physical Exam:  General: alert, cooperative and no distress Lochia: appropriate Uterine Fundus: firm Incision: healing well DVT Evaluation: No evidence of DVT seen on physical exam.  Discharge Diagnoses: Term Pregnancy-delivered  Discharge Information: Date: 07/12/2013 Activity: pelvic rest Diet: routine Medications: PNV, Ibuprofen and Percocet Condition: stable Instructions: refer to practice specific booklet Discharge to: home   Newborn Data: Live born female  Birth Weight: 7 lb 14.5 oz (3586 g) APGAR: 8, 9  Home with mother.  Marielouise Amey II,Karisma Meiser E 07/12/2013, 8:17 AM

## 2013-07-13 ENCOUNTER — Inpatient Hospital Stay (HOSPITAL_COMMUNITY): Admission: RE | Admit: 2013-07-13 | Payer: Managed Care, Other (non HMO) | Source: Ambulatory Visit

## 2014-05-17 ENCOUNTER — Encounter (HOSPITAL_COMMUNITY): Payer: Self-pay | Admitting: *Deleted

## 2014-07-21 LAB — OB RESULTS CONSOLE GC/CHLAMYDIA
CHLAMYDIA, DNA PROBE: NEGATIVE
Gonorrhea: NEGATIVE

## 2014-07-21 LAB — OB RESULTS CONSOLE HEPATITIS B SURFACE ANTIGEN: Hepatitis B Surface Ag: NEGATIVE

## 2014-07-21 LAB — OB RESULTS CONSOLE ABO/RH: RH Type: POSITIVE

## 2014-07-21 LAB — OB RESULTS CONSOLE RPR: RPR: NONREACTIVE

## 2014-07-21 LAB — OB RESULTS CONSOLE RUBELLA ANTIBODY, IGM: Rubella: IMMUNE

## 2014-07-21 LAB — OB RESULTS CONSOLE HIV ANTIBODY (ROUTINE TESTING): HIV: NONREACTIVE

## 2014-07-21 LAB — OB RESULTS CONSOLE ANTIBODY SCREEN: Antibody Screen: NEGATIVE

## 2015-01-21 ENCOUNTER — Encounter (HOSPITAL_COMMUNITY): Payer: Self-pay

## 2015-01-23 NOTE — H&P (Signed)
Tina Lowery is a 34 y.o. female presenting for primary C/S secondary to placenta previa.  Antepartum course uncomplicated.  Maternal Medical History:  Prenatal complications: Placental abnormality.   Prenatal Complications - Diabetes: none.    OB History    Gravida Para Term Preterm AB TAB SAB Ectopic Multiple Living   2 1 1       1      Past Medical History  Diagnosis Date  . Abnormal Pap smear   . Hx of varicella    Past Surgical History  Procedure Laterality Date  . Breast surgery      aug   Family History: family history includes Cancer in her paternal grandmother; Heart disease in her maternal grandfather. Social History:  reports that she has never smoked. She has never used smokeless tobacco. She reports that she does not drink alcohol or use illicit drugs.   Prenatal Transfer Tool  Maternal Diabetes: No Genetic Screening: Normal Maternal Ultrasounds/Referrals: Normal Fetal Ultrasounds or other Referrals:  None Maternal Substance Abuse:  No Significant Maternal Medications:  None Significant Maternal Lab Results:  None Other Comments:  None  ROS    unknown if currently breastfeeding. Maternal Exam:  Abdomen: Patient reports no abdominal tenderness. Fundal height is c/w dates.   Estimated fetal weight is 7#.       Physical Exam  Constitutional: She is oriented to person, place, and time. She appears well-developed and well-nourished.  GI: Soft. There is no rebound and no guarding.  Neurological: She is alert and oriented to person, place, and time.  Skin: Skin is warm and dry.  Psychiatric: She has a normal mood and affect. Her behavior is normal.    Prenatal labs: ABO, Rh: O/Positive/-- (01/06 0000) Antibody: Negative (01/06 0000) Rubella: Immune (01/06 0000) RPR: Nonreactive (01/06 0000)  HBsAg: Negative (01/06 0000)  HIV: Non-reactive (01/06 0000)  GBS:     Assessment/Plan: 33yo G2P1001 at 3630w6d with previa -Primary C/S-Patient is counseled  about risk of bleeding, infection, scarring and damage to surrounding structures.  All questions were answered and the patient wishes to proceed.   Tiffiny Worthy 01/23/2015, 7:21 PM

## 2015-01-24 ENCOUNTER — Encounter (HOSPITAL_COMMUNITY): Payer: Self-pay

## 2015-01-24 ENCOUNTER — Encounter (HOSPITAL_COMMUNITY)
Admission: RE | Admit: 2015-01-24 | Discharge: 2015-01-24 | Disposition: A | Discharge status: Home / Self Care | Payer: BLUE CROSS/BLUE SHIELD | Source: Ambulatory Visit | Attending: Obstetrics & Gynecology | Admitting: Obstetrics & Gynecology

## 2015-01-24 LAB — CBC
HCT: 39.5 % (ref 36.0–46.0)
Hemoglobin: 13.2 g/dL (ref 12.0–15.0)
MCH: 29 pg (ref 26.0–34.0)
MCHC: 33.4 g/dL (ref 30.0–36.0)
MCV: 86.8 fL (ref 78.0–100.0)
Platelets: 138 10*3/uL — ABNORMAL LOW (ref 150–400)
RBC: 4.55 MIL/uL (ref 3.87–5.11)
RDW: 14.6 % (ref 11.5–15.5)
WBC: 10.2 10*3/uL (ref 4.0–10.5)

## 2015-01-24 LAB — TYPE AND SCREEN
ABO/RH(D): O POS
ANTIBODY SCREEN: NEGATIVE

## 2015-01-24 LAB — ABO/RH: ABO/RH(D): O POS

## 2015-01-24 MED ORDER — DEXTROSE 5 % IV SOLN
INTRAVENOUS | Status: AC
  Administered 2015-01-25: 300 mL via INTRAVENOUS
  Filled 2015-01-24: qty 8.25

## 2015-01-24 NOTE — Patient Instructions (Signed)
   Your procedure is scheduled on: July 12 at 1pm  [facility] at: 1130am Pick up the phone at the desk and dial [phone] and inform us of your arrival.  Please call this number if you have any problems the morning of surgery: [phone]  Remember: Do not eat food after midnight: July 11 Do not drink clear liquids after: 9AM Take these medicines the morning of surgery with a SIP OF WATER: NONE  Do not wear jewelry, make-up, or FINGER nail polish No metal in your hair or on your body. Do not wear lotions, powders, perfumes.  You may wear deodorant.  Do not bring valuables to the hospital. Contacts, dentures or bridgework may not be worn into surgery.  Leave suitcase in the car. After Surgery it may be brought to your room. For patients being admitted to the hospital, checkout time is 11:00am the day of discharge.

## 2015-01-25 ENCOUNTER — Inpatient Hospital Stay (HOSPITAL_COMMUNITY): Payer: BLUE CROSS/BLUE SHIELD | Admitting: Anesthesiology

## 2015-01-25 ENCOUNTER — Inpatient Hospital Stay (HOSPITAL_COMMUNITY)
Admission: AD | Admit: 2015-01-25 | Discharge: 2015-01-27 | DRG: 766 | Disposition: A | Discharge status: Home / Self Care | Payer: BLUE CROSS/BLUE SHIELD | Source: Ambulatory Visit | Attending: Obstetrics & Gynecology | Admitting: Obstetrics & Gynecology

## 2015-01-25 ENCOUNTER — Encounter (HOSPITAL_COMMUNITY)
Admission: AD | Disposition: A | Discharge status: Home / Self Care | Payer: Self-pay | Source: Ambulatory Visit | Attending: Obstetrics & Gynecology

## 2015-01-25 ENCOUNTER — Encounter (HOSPITAL_COMMUNITY): Payer: Self-pay | Admitting: Anesthesiology

## 2015-01-25 DIAGNOSIS — Z3A36 36 weeks gestation of pregnancy: Secondary | ICD-10-CM | POA: Diagnosis present

## 2015-01-25 DIAGNOSIS — O4403 Placenta previa specified as without hemorrhage, third trimester: Secondary | ICD-10-CM | POA: Diagnosis present

## 2015-01-25 DIAGNOSIS — Z98891 History of uterine scar from previous surgery: Secondary | ICD-10-CM

## 2015-01-25 LAB — RPR: RPR: NONREACTIVE

## 2015-01-25 SURGERY — Surgical Case
Anesthesia: Spinal

## 2015-01-25 MED ORDER — MEPERIDINE HCL 25 MG/ML IJ SOLN: 6.2500 mg | INTRAMUSCULAR | Status: DC | PRN

## 2015-01-25 MED ORDER — OXYTOCIN 40 UNITS IN LACTATED RINGERS INFUSION - SIMPLE MED
62.5000 mL/h | INTRAVENOUS | Status: AC
Start: 2015-01-25 — End: 2015-01-26

## 2015-01-25 MED ORDER — BUPIVACAINE IN DEXTROSE 0.75-8.25 % IT SOLN
INTRATHECAL | Status: DC | PRN
  Administered 2015-01-25: 1.6 mL via INTRATHECAL

## 2015-01-25 MED ORDER — ONDANSETRON HCL 4 MG/2ML IJ SOLN
INTRAMUSCULAR | Status: AC
  Filled 2015-01-25: qty 2

## 2015-01-25 MED ORDER — PHENYLEPHRINE 40 MCG/ML (10ML) SYRINGE FOR IV PUSH (FOR BLOOD PRESSURE SUPPORT)
PREFILLED_SYRINGE | INTRAVENOUS | Status: AC
  Filled 2015-01-25: qty 10

## 2015-01-25 MED ORDER — NALBUPHINE HCL 10 MG/ML IJ SOLN: 5.0000 mg | INTRAMUSCULAR | Status: DC | PRN

## 2015-01-25 MED ORDER — NALBUPHINE HCL 10 MG/ML IJ SOLN: 5.0000 mg | Freq: Once | INTRAMUSCULAR | Status: AC | PRN

## 2015-01-25 MED ORDER — DIBUCAINE 1 % RE OINT: 1.0000 "application " | TOPICAL_OINTMENT | RECTAL | Status: DC | PRN

## 2015-01-25 MED ORDER — ERYTHROMYCIN 5 MG/GM OP OINT
TOPICAL_OINTMENT | OPHTHALMIC | Status: AC
  Filled 2015-01-25: qty 1

## 2015-01-25 MED ORDER — PHENYLEPHRINE 8 MG IN D5W 100 ML (0.08MG/ML) PREMIX OPTIME
INJECTION | INTRAVENOUS | Status: DC | PRN
  Administered 2015-01-25: 60 ug/min via INTRAVENOUS

## 2015-01-25 MED ORDER — MORPHINE SULFATE (PF) 0.5 MG/ML IJ SOLN
INTRAMUSCULAR | Status: DC | PRN
  Administered 2015-01-25: 200 ug via EPIDURAL

## 2015-01-25 MED ORDER — SIMETHICONE 80 MG PO CHEW
80.0000 mg | CHEWABLE_TABLET | Freq: Three times a day (TID) | ORAL | Status: DC
  Administered 2015-01-25 – 2015-01-27 (×6): 80 mg via ORAL
  Filled 2015-01-25 (×5): qty 1

## 2015-01-25 MED ORDER — SENNOSIDES-DOCUSATE SODIUM 8.6-50 MG PO TABS
2.0000 | ORAL_TABLET | ORAL | Status: DC
  Administered 2015-01-26 (×2): 2 via ORAL
  Filled 2015-01-25 (×2): qty 2

## 2015-01-25 MED ORDER — OXYCODONE-ACETAMINOPHEN 5-325 MG PO TABS: 1.0000 | ORAL_TABLET | ORAL | Status: DC | PRN

## 2015-01-25 MED ORDER — SODIUM CHLORIDE 0.9 % IJ SOLN: 3.0000 mL | INTRAMUSCULAR | Status: DC | PRN

## 2015-01-25 MED ORDER — LACTATED RINGERS IV SOLN
INTRAVENOUS | Status: DC | PRN
  Administered 2015-01-25: 12:00:00 via INTRAVENOUS

## 2015-01-25 MED ORDER — WITCH HAZEL-GLYCERIN EX PADS: 1.0000 "application " | MEDICATED_PAD | CUTANEOUS | Status: DC | PRN

## 2015-01-25 MED ORDER — SCOPOLAMINE 1 MG/3DAYS TD PT72
1.0000 | MEDICATED_PATCH | Freq: Once | TRANSDERMAL | Status: DC
  Filled 2015-01-25: qty 1

## 2015-01-25 MED ORDER — ACETAMINOPHEN 325 MG PO TABS: 650.0000 mg | ORAL_TABLET | ORAL | Status: DC | PRN

## 2015-01-25 MED ORDER — ZOLPIDEM TARTRATE 5 MG PO TABS: 5.0000 mg | ORAL_TABLET | Freq: Every evening | ORAL | Status: DC | PRN

## 2015-01-25 MED ORDER — TETANUS-DIPHTH-ACELL PERTUSSIS 5-2.5-18.5 LF-MCG/0.5 IM SUSP
0.5000 mL | Freq: Once | INTRAMUSCULAR | Status: DC
Start: 2015-01-26 — End: 2015-01-27

## 2015-01-25 MED ORDER — OXYTOCIN 10 UNIT/ML IJ SOLN
40.0000 [IU] | INTRAVENOUS | Status: DC | PRN
  Administered 2015-01-25: 40 [IU] via INTRAVENOUS

## 2015-01-25 MED ORDER — DIPHENHYDRAMINE HCL 25 MG PO CAPS: 25.0000 mg | ORAL_CAPSULE | ORAL | Status: DC | PRN

## 2015-01-25 MED ORDER — FENTANYL CITRATE (PF) 100 MCG/2ML IJ SOLN
INTRAMUSCULAR | Status: AC
  Filled 2015-01-25: qty 2

## 2015-01-25 MED ORDER — PHENYLEPHRINE HCL 10 MG/ML IJ SOLN
INTRAMUSCULAR | Status: DC | PRN
  Administered 2015-01-25: 120 ug via INTRAVENOUS
  Administered 2015-01-25 (×2): 40 ug via INTRAVENOUS

## 2015-01-25 MED ORDER — PRENATAL MULTIVITAMIN CH
1.0000 | ORAL_TABLET | Freq: Every day | ORAL | Status: DC
Start: 2015-01-26 — End: 2015-01-27
  Administered 2015-01-26 – 2015-01-27 (×2): 1 via ORAL
  Filled 2015-01-25 (×2): qty 1

## 2015-01-25 MED ORDER — LANOLIN HYDROUS EX OINT: 1.0000 "application " | TOPICAL_OINTMENT | CUTANEOUS | Status: DC | PRN

## 2015-01-25 MED ORDER — SCOPOLAMINE 1 MG/3DAYS TD PT72
MEDICATED_PATCH | TRANSDERMAL | Status: AC
  Administered 2015-01-25: 1.5 mg via TRANSDERMAL
  Filled 2015-01-25: qty 1

## 2015-01-25 MED ORDER — FENTANYL CITRATE (PF) 100 MCG/2ML IJ SOLN
INTRAMUSCULAR | Status: DC | PRN
  Administered 2015-01-25: 20 ug via INTRAVENOUS

## 2015-01-25 MED ORDER — SIMETHICONE 80 MG PO CHEW: 80.0000 mg | CHEWABLE_TABLET | ORAL | Status: DC | PRN

## 2015-01-25 MED ORDER — LACTATED RINGERS IV SOLN
Freq: Once | INTRAVENOUS | Status: AC
  Administered 2015-01-25 (×2): via INTRAVENOUS

## 2015-01-25 MED ORDER — PHENYLEPHRINE 8 MG IN D5W 100 ML (0.08MG/ML) PREMIX OPTIME
INJECTION | INTRAVENOUS | Status: AC
  Filled 2015-01-25: qty 100

## 2015-01-25 MED ORDER — ONDANSETRON HCL 4 MG/2ML IJ SOLN: 4.0000 mg | Freq: Three times a day (TID) | INTRAMUSCULAR | Status: DC | PRN

## 2015-01-25 MED ORDER — VITAMIN K1 1 MG/0.5ML IJ SOLN
INTRAMUSCULAR | Status: AC
  Filled 2015-01-25: qty 0.5

## 2015-01-25 MED ORDER — DIPHENHYDRAMINE HCL 50 MG/ML IJ SOLN: 12.5000 mg | INTRAMUSCULAR | Status: DC | PRN

## 2015-01-25 MED ORDER — OXYTOCIN 10 UNIT/ML IJ SOLN
INTRAMUSCULAR | Status: AC
  Filled 2015-01-25: qty 4

## 2015-01-25 MED ORDER — DEXTROSE IN LACTATED RINGERS 5 % IV SOLN: INTRAVENOUS | Status: DC

## 2015-01-25 MED ORDER — MENTHOL 3 MG MT LOZG: 1.0000 | LOZENGE | OROMUCOSAL | Status: DC | PRN

## 2015-01-25 MED ORDER — NALOXONE HCL 0.4 MG/ML IJ SOLN: 0.4000 mg | INTRAMUSCULAR | Status: DC | PRN

## 2015-01-25 MED ORDER — MORPHINE SULFATE 0.5 MG/ML IJ SOLN
INTRAMUSCULAR | Status: AC
  Filled 2015-01-25: qty 10

## 2015-01-25 MED ORDER — LACTATED RINGERS IV SOLN: INTRAVENOUS | Status: DC

## 2015-01-25 MED ORDER — PROMETHAZINE HCL 25 MG/ML IJ SOLN: 6.2500 mg | INTRAMUSCULAR | Status: DC | PRN

## 2015-01-25 MED ORDER — NALOXONE HCL 1 MG/ML IJ SOLN
1.0000 ug/kg/h | INTRAVENOUS | Status: DC | PRN
  Filled 2015-01-25: qty 2

## 2015-01-25 MED ORDER — FENTANYL CITRATE (PF) 100 MCG/2ML IJ SOLN: 25.0000 ug | INTRAMUSCULAR | Status: DC | PRN

## 2015-01-25 MED ORDER — ACETAMINOPHEN 500 MG PO TABS
ORAL_TABLET | ORAL | Status: AC
  Administered 2015-01-25: 1000 mg via ORAL
  Filled 2015-01-25: qty 2

## 2015-01-25 MED ORDER — OXYCODONE-ACETAMINOPHEN 5-325 MG PO TABS
2.0000 | ORAL_TABLET | ORAL | Status: DC | PRN
  Filled 2015-01-25: qty 2

## 2015-01-25 MED ORDER — ACETAMINOPHEN 500 MG PO TABS
1000.0000 mg | ORAL_TABLET | Freq: Four times a day (QID) | ORAL | Status: AC
  Filled 2015-01-25 (×2): qty 2

## 2015-01-25 MED ORDER — ONDANSETRON HCL 4 MG/2ML IJ SOLN
INTRAMUSCULAR | Status: DC | PRN
  Administered 2015-01-25: 4 mg via INTRAVENOUS

## 2015-01-25 MED ORDER — IBUPROFEN 600 MG PO TABS
600.0000 mg | ORAL_TABLET | Freq: Four times a day (QID) | ORAL | Status: DC
  Administered 2015-01-26 – 2015-01-27 (×7): 600 mg via ORAL
  Filled 2015-01-25 (×7): qty 1

## 2015-01-25 MED ORDER — SIMETHICONE 80 MG PO CHEW
80.0000 mg | CHEWABLE_TABLET | ORAL | Status: DC
  Administered 2015-01-26 (×2): 80 mg via ORAL
  Filled 2015-01-25 (×2): qty 1

## 2015-01-25 MED ORDER — SCOPOLAMINE 1 MG/3DAYS TD PT72
1.0000 | MEDICATED_PATCH | Freq: Once | TRANSDERMAL | Status: DC
  Administered 2015-01-25: 1.5 mg via TRANSDERMAL

## 2015-01-25 MED ORDER — DIPHENHYDRAMINE HCL 25 MG PO CAPS: 25.0000 mg | ORAL_CAPSULE | Freq: Four times a day (QID) | ORAL | Status: DC | PRN

## 2015-01-25 MED ORDER — EPHEDRINE SULFATE 50 MG/ML IJ SOLN
INTRAMUSCULAR | Status: DC | PRN
  Administered 2015-01-25: 15 mg via INTRAVENOUS

## 2015-01-25 MED ORDER — LACTATED RINGERS IV SOLN
INTRAVENOUS | Status: DC
  Administered 2015-01-25: 12:00:00 via INTRAVENOUS

## 2015-01-25 SURGICAL SUPPLY — 34 items
APL SKNCLS STERI-STRIP NONHPOA (GAUZE/BANDAGES/DRESSINGS) ×1
BENZOIN TINCTURE PRP APPL 2/3 (GAUZE/BANDAGES/DRESSINGS) ×2 IMPLANT
CLAMP CORD UMBIL (MISCELLANEOUS) IMPLANT
CLOSURE WOUND 1/2 X4 (GAUZE/BANDAGES/DRESSINGS) ×1
CLOTH BEACON ORANGE TIMEOUT ST (SAFETY) ×3 IMPLANT
DRAPE SHEET LG 3/4 BI-LAMINATE (DRAPES) IMPLANT
DRSG OPSITE POSTOP 4X10 (GAUZE/BANDAGES/DRESSINGS) ×3 IMPLANT
DURAPREP 26ML APPLICATOR (WOUND CARE) ×3 IMPLANT
ELECT REM PT RETURN 9FT ADLT (ELECTROSURGICAL) ×3
ELECTRODE REM PT RTRN 9FT ADLT (ELECTROSURGICAL) ×1 IMPLANT
EXTRACTOR VACUUM KIWI (MISCELLANEOUS) ×2 IMPLANT
EXTRACTOR VACUUM M CUP 4 TUBE (SUCTIONS) IMPLANT
EXTRACTOR VACUUM M CUP 4' TUBE (SUCTIONS)
GLOVE BIO SURGEON STRL SZ 6 (GLOVE) ×3 IMPLANT
GLOVE BIOGEL PI IND STRL 6 (GLOVE) ×2 IMPLANT
GLOVE BIOGEL PI INDICATOR 6 (GLOVE) ×4
GOWN STRL REUS W/TWL LRG LVL3 (GOWN DISPOSABLE) ×6 IMPLANT
KIT ABG SYR 3ML LUER SLIP (SYRINGE) ×3 IMPLANT
LIQUID BAND (GAUZE/BANDAGES/DRESSINGS) IMPLANT
NDL HYPO 25X5/8 SAFETYGLIDE (NEEDLE) ×1 IMPLANT
NEEDLE HYPO 25X5/8 SAFETYGLIDE (NEEDLE) ×3 IMPLANT
NS IRRIG 1000ML POUR BTL (IV SOLUTION) ×3 IMPLANT
PACK C SECTION WH (CUSTOM PROCEDURE TRAY) ×3 IMPLANT
PAD OB MATERNITY 4.3X12.25 (PERSONAL CARE ITEMS) ×3 IMPLANT
SPONGE LAP 18X18 X RAY DECT (DISPOSABLE) ×2 IMPLANT
STRIP CLOSURE SKIN 1/2X4 (GAUZE/BANDAGES/DRESSINGS) ×1 IMPLANT
SUT CHROMIC 0 CTX 36 (SUTURE) ×9 IMPLANT
SUT MON AB 2-0 CT1 27 (SUTURE) ×3 IMPLANT
SUT PDS AB 0 CT1 27 (SUTURE) IMPLANT
SUT PLAIN 0 NONE (SUTURE) IMPLANT
SUT VIC AB 0 CT1 36 (SUTURE) ×2 IMPLANT
SUT VIC AB 4-0 KS 27 (SUTURE) ×2 IMPLANT
TOWEL OR 17X24 6PK STRL BLUE (TOWEL DISPOSABLE) ×3 IMPLANT
TRAY FOLEY CATH SILVER 14FR (SET/KITS/TRAYS/PACK) IMPLANT

## 2015-01-25 NOTE — Progress Notes (Signed)
Pt states she will start her DEBP in the next 10-15 min.

## 2015-01-25 NOTE — Lactation Note (Signed)
This note was copied from the chart of Tina Barnie Mortracy Loewenstein. Lactation Consultation Note LC unable to visit this shift.  Report from nsy nurse mom was able to pump 10mls and bottle fed to this LPT (7827w6d) baby. Mom has history of breast augmentation, but reports good milk supply with her now 18months old.      Patient Name: Tina Lowery ZOXWR'UToday's Date: 01/25/2015     Maternal Data    Feeding    LATCH Score/Interventions                      Lactation Tools Discussed/Used     Consult Status      Chrles Selley, Arvella MerlesJana Lynn 01/25/2015, 9:26 PM

## 2015-01-25 NOTE — Transfer of Care (Signed)
Immediate Anesthesia Transfer of Care Note  Patient: Tina Lowery  Procedure(s) Performed: Procedure(s) with comments: CESAREAN SECTION (N/A) - PRIMARY EDC  02/16/15 ALLERGY TO PCN  Patient Location: PACU  Anesthesia Type:Spinal  Level of Consciousness: awake, alert , oriented and patient cooperative  Airway & Oxygen Therapy: Patient Spontanous Breathing  Post-op Assessment: Report given to RN and Post -op Vital signs reviewed and stable  Post vital signs: Reviewed and stable  Last Vitals:  Filed Vitals:   01/25/15 1154  BP: 124/75  Pulse: 116  Temp: 36.8 C  Resp: 20    Complications: No apparent anesthesia complications

## 2015-01-25 NOTE — Anesthesia Postprocedure Evaluation (Signed)
  Anesthesia Post-op Note  Patient: Tina Lowery  Procedure(s) Performed: Procedure(s) with comments: CESAREAN SECTION (N/A) - PRIMARY EDC  02/16/15 ALLERGY TO PCN  Patient Location: PACU  Anesthesia Type:Spinal  Level of Consciousness: awake, alert  and oriented  Airway and Oxygen Therapy: Patient Spontanous Breathing  Post-op Pain: none  Post-op Assessment: Post-op Vital signs reviewed, Patient's Cardiovascular Status Stable, Respiratory Function Stable, Patent Airway and No signs of Nausea or vomiting              Post-op Vital Signs: Reviewed and stable  Last Vitals:  Filed Vitals:   01/25/15 1154  BP: 124/75  Pulse: 116  Temp: 36.8 C  Resp: 20    Complications: No apparent anesthesia complications

## 2015-01-25 NOTE — Progress Notes (Signed)
No change to H&P.  Aemilia Dedrick, DO 

## 2015-01-25 NOTE — Anesthesia Procedure Notes (Signed)
Spinal Patient location during procedure: OR Start time: 01/25/2015 1:00 PM End time: 01/25/2015 1:15 PM Staffing Anesthesiologist: Sebastian AcheMANNY, Ebbie Sorenson Preanesthetic Checklist Completed: patient identified, site marked, surgical consent, pre-op evaluation, timeout performed, IV checked, risks and benefits discussed and monitors and equipment checked Spinal Block Patient position: sitting Prep: site prepped and draped and DuraPrep Patient monitoring: heart rate, blood pressure and continuous pulse ox Approach: midline Location: L3-4 Injection technique: single-shot Needle Needle type: Pencil-Tip  Needle gauge: 24 G Needle length: 9 cm Needle insertion depth: 5 cm Assessment Sensory level: T4 Additional Notes No complications, marcaine with dextrose 1.6cc, fent 20 mcg, morphine 200 mcg

## 2015-01-25 NOTE — Anesthesia Preprocedure Evaluation (Addendum)
Anesthesia Evaluation  Patient identified by MRN, date of birth, ID band Patient awake    Reviewed: Allergy & Precautions, NPO status , Patient's Chart, lab work & pertinent test results, reviewed documented beta blocker date and time   Airway Mallampati: II   Neck ROM: Full    Dental  (+) Dental Advisory Given   Pulmonary neg pulmonary ROS,  breath sounds clear to auscultation        Cardiovascular Rhythm:Regular     Neuro/Psych negative neurological ROS     GI/Hepatic negative GI ROS, Neg liver ROS,   Endo/Other    Renal/GU negative Renal ROS     Musculoskeletal   Abdominal (+)  Abdomen: soft.    Peds  Hematology 13/39  plts 138   Anesthesia Other Findings Complete Previa  Reproductive/Obstetrics (+) Pregnancy Complete Previa                          Anesthesia Physical Anesthesia Plan  ASA: II  Anesthesia Plan: Spinal   Post-op Pain Management:    Induction:   Airway Management Planned: Natural Airway  Additional Equipment:   Intra-op Plan:   Post-operative Plan:   Informed Consent: I have reviewed the patients History and Physical, chart, labs and discussed the procedure including the risks, benefits and alternatives for the proposed anesthesia with the patient or authorized representative who has indicated his/her understanding and acceptance.     Plan Discussed with:   Anesthesia Plan Comments: (Complete previa, 2 IV)       Anesthesia Quick Evaluation

## 2015-01-25 NOTE — Op Note (Signed)
Tina Lowery PROCEDURE DATE: 01/25/2015  PREOPERATIVE DIAGNOSIS: Intrauterine pregnancy at  7279w6d weeks gestation, placenta previa  POSTOPERATIVE DIAGNOSIS: The same  PROCEDURE: Primary Low Transverse Cesarean Section  SURGEON:  Dr. Mitchel HonourMegan Saki Legore  INDICATIONS: Tina Lowery is a 34 y.o. [id number] at 7879w6d scheduled for cesarean section secondary to previa.  The risks of cesarean section discussed with the patient included but were not limited to: bleeding which may require transfusion or reoperation; infection which may require antibiotics; injury to bowel, bladder, ureters or other surrounding organs; injury to the fetus; need for additional procedures including hysterectomy in the event of a life-threatening hemorrhage; placental abnormalities wth subsequent pregnancies, incisional problems, thromboembolic phenomenon and other postoperative/anesthesia complications. The patient concurred with the proposed plan, giving informed written consent for the procedure.    FINDINGS:  Viable female infant in cephalic presentation, APGAR 7,7:  Weight pending  Clear amniotic fluid.  Intact placenta previa; anterior, three vessel cord.  Grossly normal uterus, ovaries and fallopian tubes. .   ANESTHESIA:    Spinal ESTIMATED BLOOD LOSS: 1000 ml SPECIMENS: Placenta sent to L&D COMPLICATIONS: None immediate  PROCEDURE IN DETAIL:  The patient received intravenous antibiotics and had sequential compression devices applied to her lower extremities while in the preoperative area.  She was then taken to the operating room where spinal anesthesia was administered and was found to be adequate. She was then placed in a dorsal supine position with a leftward tilt, and prepped and draped in a sterile manner.  A foley catheter was placed into her bladder and attached to constant gravity.  After an adequate timeout was performed, a Pfannenstiel skin incision was made with scalpel and carried through to the underlying layer of  fascia. The fascia was incised in the midline and this incision was extended bilaterally using the Mayo scissors. Kocher clamps were applied to the superior aspect of the fascial incision and the underlying rectus muscles were dissected off bluntly. A similar process was carried out on the inferior aspect of the facial incision. The rectus muscles were separated in the midline bluntly and the peritoneum was entered bluntly. A bladder flap was created sharply and developed bluntly.  The bladder was protected behind the bladder blade.  A transverse hysterotomy was made with a scalpel and extended bilaterally bluntly.  The anterior placenta was encountered first and completely obscured palpation of fetal parts.  Ultimately, the placenta was mostly delivered before the membranes were encountered and ruptured The bladder blade was then removed. The infant was successfully delivered using a single Kiwi vacuum pull for assistance, and cord was clamped and cut and infant was handed over to awaiting neonatology team. Uterine massage was then administered and the remaining placenta delivered intact with three-vessel cord. The uterus was cleared of clot and debris.  The hysterotomy was closed with 0 chromic.  A second imbricating suture of 0 chromic was used to reinforce the incision and aid in hemostasis.  The peritoneum and rectus muscles were noted to be hemostatic.  The fascia was closed with 0-Vicryl in a running fashion with good restoration of anatomy.  The subcutaneus tissue was copiously irrigated.  The skin was closed with 4-0 Vicryl in a subcuticular fashion.  Pt tolerated the procedure will.  All counts were correct x2.  Pt went to the recovery room in stable condition.

## 2015-01-26 ENCOUNTER — Encounter (HOSPITAL_COMMUNITY): Payer: Self-pay | Admitting: Obstetrics & Gynecology

## 2015-01-26 LAB — CBC
HEMATOCRIT: 27.4 % — AB (ref 36.0–46.0)
Hemoglobin: 9.1 g/dL — ABNORMAL LOW (ref 12.0–15.0)
MCH: 28.6 pg (ref 26.0–34.0)
MCHC: 33.2 g/dL (ref 30.0–36.0)
MCV: 86.2 fL (ref 78.0–100.0)
Platelets: 121 10*3/uL — ABNORMAL LOW (ref 150–400)
RBC: 3.18 MIL/uL — ABNORMAL LOW (ref 3.87–5.11)
RDW: 14.5 % (ref 11.5–15.5)
WBC: 10.3 10*3/uL (ref 4.0–10.5)

## 2015-01-26 LAB — BIRTH TISSUE RECOVERY COLLECTION (PLACENTA DONATION)

## 2015-01-26 NOTE — Progress Notes (Signed)
Subjective: Postpartum Day 1: Cesarean Delivery Patient reports tolerating PO and no problems voiding.    Objective: Vital signs in last 24 hours: Temp:  [97 F (36.1 C)-98.3 F (36.8 C)] 98.1 F (36.7 C) (07/13 0809) Pulse Rate:  [69-116] 75 (07/13 0809) Resp:  [14-20] 20 (07/13 0809) BP: (94-124)/(49-79) 116/70 mmHg (07/13 0809) SpO2:  [99 %-100 %] 99 % (07/13 0415)  Physical Exam:  General: alert, cooperative and no distress Lochia: appropriate Uterine Fundus: firm Incision: healing well DVT Evaluation: No evidence of DVT seen on physical exam.   Recent Labs  01/24/15 1250 01/26/15 0500  HGB 13.2 9.1*  HCT 39.5 27.4*    Assessment/Plan: Status post Cesarean section. Doing well postoperatively.  Continue current care.  Tina Lowery II,Armina Galloway E 01/26/2015, 8:44 AM

## 2015-01-26 NOTE — Addendum Note (Signed)
Addendum  created 01/26/15 0854 by Yolonda KidaAlison L Claudette Wermuth, CRNA   Modules edited: Notes Section   Notes Section:  File: [id number]

## 2015-01-26 NOTE — Lactation Note (Signed)
This note was copied from the chart of Tina Barnie Mortracy Litzenberger. Lactation Consultation Note  Patient Name: Tina Lowery ZOXWR'UToday's Date: 01/26/2015 Reason for consult: Initial assessment;Late preterm infant;Breast surgery Mom plans to pump/bottle feed. She has her own DEBP. LC encouraged Mom to pump every 3 hours for 15-20 minutes to encourage milk production and to have EBM to supplement. LPT given to Mom for review. Lactation brochure left for review, advised of OP services and support group. Encouraged Mom to call for questions/concerns.   Maternal Data Has patient been taught Hand Expression?: No (Mom reports she knows how to hand express, declined assist) Does the patient have breastfeeding experience prior to this delivery?: Yes  Feeding    LATCH Score/Interventions                      Lactation Tools Discussed/Used     Consult Status Consult Status: PRN    Alfred LevinsGranger, Murray Durrell Ann 01/26/2015, 10:29 AM

## 2015-01-26 NOTE — Anesthesia Postprocedure Evaluation (Signed)
  Anesthesia Post-op Note  Patient: Tina Lowery  Procedure(s) Performed: Procedure(s) with comments: CESAREAN SECTION (N/A) - PRIMARY EDC  02/16/15 ALLERGY TO PCN  Patient Location: Mother/Baby  Anesthesia Type:Spinal  Level of Consciousness: awake, alert , oriented and patient cooperative  Airway and Oxygen Therapy: Patient Spontanous Breathing  Post-op Pain: none  Post-op Assessment: Post-op Vital signs reviewed, Patient's Cardiovascular Status Stable, Respiratory Function Stable, Patent Airway, No headache, No backache and Patient able to bend at knees  Post-op Vital Signs: Reviewed and stable  Last Vitals:  Filed Vitals:   01/26/15 0809  BP: 116/70  Pulse: 75  Temp: 36.7 C  Resp: 20    Complications: No apparent anesthesia complications

## 2015-01-27 NOTE — Lactation Note (Signed)
This note was copied from the chart of Tina Barnie Mortracy Dudgeon. Lactation Consultation Note  Followed up with Mother who is pumping and bottle feeding. Has recently pumped 2-3 oz.  Has volume guidelines. Knows to hand express before and after pumping. Denies problems or questions.  Patient Name: Tina Lowery ZOXWR'UToday's Date: 01/27/2015     Maternal Data    Feeding    LATCH Score/Interventions                      Lactation Tools Discussed/Used     Consult Status      Dahlia ByesBerkelhammer, Ruth Boschen 01/27/2015, 1:50 PM

## 2015-01-27 NOTE — Progress Notes (Signed)
Post Partum Day 2 Subjective: no complaints, up ad lib and tolerating PO  Objective: Blood pressure 99/56, pulse 74, temperature 98 F (36.7 C), temperature source Oral, resp. rate 19, SpO2 100 %, unknown if currently breastfeeding.  Physical Exam:  General: alert and cooperative Lochia: appropriate Uterine Fundus: firm Incision: healing well, no significant drainage DVT Evaluation: No evidence of DVT seen on physical exam.   Recent Labs  01/24/15 1250 01/26/15 0500  HGB 13.2 9.1*  HCT 39.5 27.4*    Assessment/Plan: Plan for discharge tomorrow, Breastfeeding and Circumcision prior to discharge   LOS: 2 days   Danel Studzinski 01/27/2015, 9:00 AM

## 2015-01-27 NOTE — Discharge Summary (Signed)
Obstetric Discharge Summary Reason for Admission: cesarean section Prenatal Procedures: ultrasound Intrapartum Procedures: cesarean: low cervical, transverse Postpartum Procedures: none Complications-Operative and Postpartum: none HEMOGLOBIN  Date Value Ref Range Status  01/26/2015 9.1* 12.0 - 15.0 g/dL Final    Comment:    DELTA CHECK NOTED REPEATED TO VERIFY    HCT  Date Value Ref Range Status  01/26/2015 27.4* 36.0 - 46.0 % Final    Physical Exam:  General: alert and cooperative Lochia: appropriate Uterine Fundus: firm Incision: healing well, no significant drainage DVT Evaluation: No evidence of DVT seen on physical exam.  Discharge Diagnoses: Term Pregnancy-delivered  Discharge Information: Date: 01/27/2015 Activity: pelvic rest Diet: routine Medications: PNV and Ibuprofen Condition: stable Instructions: refer to practice specific booklet Discharge to: home Follow-up Information    Schedule an appointment as soon as possible for a visit in 2 weeks to follow up.      Newborn Data: Live born female  Birth Weight: 6 lb 8.8 oz (2971 g) APGAR: 7, 7  Home with mother.  Tina Lowery 01/27/2015, 5:59 PM

## 2015-01-27 NOTE — Progress Notes (Signed)
Discharge teaching completed with patient and FOB. Stated her follow up appointments and had no questions at this time.

## 2022-02-02 ENCOUNTER — Ambulatory Visit (INDEPENDENT_AMBULATORY_CARE_PROVIDER_SITE_OTHER): Payer: BC Managed Care – PPO | Admitting: Podiatry

## 2022-02-02 DIAGNOSIS — B070 Plantar wart: Secondary | ICD-10-CM

## 2022-02-02 NOTE — Patient Instructions (Signed)
Take dressing off in 8 hours and wash the foot with soap and water. If it is hurting or becomes uncomfortable before the 8 hours, go ahead and remove the bandage and wash the area.  If it blisters, apply antibiotic ointment and a band-aid.  Monitor for any signs/symptoms of infection. Call the office immediately if any occur or go directly to the emergency room. Call with any questions/concerns.   

## 2022-02-02 NOTE — Progress Notes (Unsigned)
Subjective:   Patient ID: Tina Lowery, female   DOB: 41 y.o.   MRN: [id number]   HPI Chief Complaint  Patient presents with   Callouses    Pt came in for a right foot Plantar wart , started 3 mths ago, pt rates her pain a 1 out of 10  Tx; pt has tried to cut it out     States that it got worse over the last few months. She injred her left heek and she thought she was ompensating and putitng more pressure on the right side. She has tried salicyclinc acid and also tried to cut the dead skin out herself. It felt better with trimming.    ROS      Objective:  Physical Exam  General: AAO x3, NAD  Dermatological: right subm 5 wart   Vascular: Dorsalis Pedis artery and Posterior Tibial artery pedal pulses are 2/4 bilateral with immedate capillary fill time. Pedal hair growth present. No varicosities and no lower extremity edema present bilateral. There is no pain with calf compression, swelling, warmth, erythema.   Neruologic: Grossly intact via light touch bilateral. Vibratory intact via tuning fork bilateral. Protective threshold with Semmes Wienstein monofilament intact to all pedal sites bilateral. Patellar and Achilles deep tendon reflexes 2+ bilateral. No Babinski or clonus noted bilateral.   Musculoskeletal: No gross boney pedal deformities bilateral. No pain, crepitus, or limitation noted with foot and ankle range of motion bilateral. Muscular strength 5/5 in all groups tested bilateral.  Gait: Unassisted, Nonantalgic.       Assessment:  ***     Plan:  -canthron -no improve aldera
